# Patient Record
Sex: Male | Born: 1960 | Race: Black or African American | Hispanic: No | Marital: Single | Smoking: Current every day smoker
Health system: Southern US, Community
[De-identification: ages and names within clinical notes are randomized; demographics above are authoritative.]

## PROBLEM LIST (undated history)

## (undated) DIAGNOSIS — I10 Essential (primary) hypertension: Secondary | ICD-10-CM

---

## 2002-04-01 ENCOUNTER — Emergency Department (HOSPITAL_COMMUNITY): Admission: EM | Admit: 2002-04-01 | Discharge: 2002-04-02 | Payer: Self-pay | Admitting: Emergency Medicine

## 2002-04-02 ENCOUNTER — Encounter: Payer: Self-pay | Admitting: Emergency Medicine

## 2002-09-06 ENCOUNTER — Encounter: Payer: Self-pay | Admitting: *Deleted

## 2002-09-06 ENCOUNTER — Emergency Department (HOSPITAL_COMMUNITY): Admission: EM | Admit: 2002-09-06 | Discharge: 2002-09-06 | Payer: Self-pay | Admitting: Emergency Medicine

## 2019-01-04 DEATH — deceased

## 2020-10-27 ENCOUNTER — Encounter (HOSPITAL_BASED_OUTPATIENT_CLINIC_OR_DEPARTMENT_OTHER): Payer: Self-pay | Admitting: Emergency Medicine

## 2020-10-27 ENCOUNTER — Other Ambulatory Visit: Payer: Self-pay

## 2020-10-27 ENCOUNTER — Emergency Department (HOSPITAL_BASED_OUTPATIENT_CLINIC_OR_DEPARTMENT_OTHER)
Admission: EM | Admit: 2020-10-27 | Discharge: 2020-10-27 | Disposition: A | Discharge status: Home / Self Care | Payer: Self-pay | Attending: Emergency Medicine | Admitting: Emergency Medicine

## 2020-10-27 DIAGNOSIS — I10 Essential (primary) hypertension: Secondary | ICD-10-CM | POA: Insufficient documentation

## 2020-10-27 DIAGNOSIS — S86211A Strain of muscle(s) and tendon(s) of anterior muscle group at lower leg level, right leg, initial encounter: Secondary | ICD-10-CM | POA: Insufficient documentation

## 2020-10-27 DIAGNOSIS — Y9301 Activity, walking, marching and hiking: Secondary | ICD-10-CM | POA: Insufficient documentation

## 2020-10-27 DIAGNOSIS — X509XXA Other and unspecified overexertion or strenuous movements or postures, initial encounter: Secondary | ICD-10-CM | POA: Insufficient documentation

## 2020-10-27 DIAGNOSIS — Y990 Civilian activity done for income or pay: Secondary | ICD-10-CM | POA: Insufficient documentation

## 2020-10-27 DIAGNOSIS — S86811A Strain of other muscle(s) and tendon(s) at lower leg level, right leg, initial encounter: Secondary | ICD-10-CM

## 2020-10-27 DIAGNOSIS — F17200 Nicotine dependence, unspecified, uncomplicated: Secondary | ICD-10-CM | POA: Insufficient documentation

## 2020-10-27 HISTORY — DX: Essential (primary) hypertension: I10

## 2020-10-27 MED ORDER — MELOXICAM 7.5 MG PO TABS: 7.5000 mg | ORAL_TABLET | Freq: Every day | ORAL | 0 refills | Status: AC

## 2020-10-27 NOTE — ED Triage Notes (Signed)
Pt states he was at work walking up an incline when he had sudden sharp pain to right calf, heard a pop sound.  Pt c/o pain, increases with movement.  No recent injury, no recent travel.

## 2020-10-27 NOTE — ED Provider Notes (Signed)
[facility] EMERGENCY DEPARTMENT Provider Note   CSN: [id number] Arrival date & time: 10/27/20  1962     History Chief Complaint  Patient presents with  . Leg Pain    David Cochran is a 60 y.o. male.  60 year old male presents with complaint of right calf pain.  Patient states that he was walking up a steep incline today when he felt a sharp pop in his right calf muscle.  Patient states he did not fall to the ground at that time but states that he has had significant difficulty bearing weight on this leg since that injury.  He denies recent antibiotic use, any numbness in the leg, falls or injuries otherwise.        Past Medical History:  Diagnosis Date  . Hypertension     There are no problems to display for this patient.    No family history on file.  Social History   Tobacco Use  . Smoking status: Current Every Day Smoker  . Smokeless tobacco: Never Used    Home Medications Prior to Admission medications   Medication Sig Start Date End Date Taking? Authorizing Provider  meloxicam (MOBIC) 7.5 MG tablet Take 1 tablet (7.5 mg total) by mouth daily for 10 days. 10/27/20 11/06/20 Yes Jeannie Fend, PA-C    Allergies    Patient has no known allergies.  Review of Systems   Review of Systems  Constitutional: Negative for fever.  Musculoskeletal: Positive for gait problem and myalgias. Negative for arthralgias and joint swelling.  Skin: Negative for color change, rash and wound.  Allergic/Immunologic: Negative for immunocompromised state.  Neurological: Negative for weakness and numbness.    Physical Exam Updated Vital Signs BP (!) 151/90   Pulse 91   Temp 98.2 F (36.8 C) (Oral)   Resp 20   Ht 6\' 1"  (1.854 m)   Wt 108.9 kg   SpO2 96%   BMI 31.66 kg/m   Physical Exam Vitals and nursing note reviewed.  Constitutional:      General: He is not in acute distress.    Appearance: He is well-developed. He is not diaphoretic.  HENT:      Head: Normocephalic and atraumatic.  Cardiovascular:     Pulses: Normal pulses.  Pulmonary:     Effort: Pulmonary effort is normal.  Musculoskeletal:        General: Swelling and tenderness present. No deformity.     Right lower leg: Swelling and tenderness present. No deformity or bony tenderness.     Right ankle:     Right Achilles Tendon: Normal. No tenderness or defects. Thompson's test negative.     Left ankle:     Left Achilles Tendon: Normal. No tenderness or defects. Thompson's test negative.     Comments: Swelling of the right calf compared to left with tenderness proximal right calf area.  Sensation intact, no weakness.  Skin:    General: Skin is warm and dry.     Findings: No erythema or rash.  Neurological:     Mental Status: He is alert and oriented to person, place, and time.     Sensory: No sensory deficit.     Motor: No weakness.  Psychiatric:        Behavior: Behavior normal.     ED Results / Procedures / Treatments   Labs (all labs ordered are listed, but only abnormal results are displayed) Labs Reviewed - No data to display  EKG None  Radiology No results  found.  Procedures Procedures   Medications Ordered in ED Medications - No data to display  ED Course  I have reviewed the triage vital signs and the nursing notes.  Pertinent labs & imaging results that were available during my care of the patient were reviewed by me and considered in my medical decision making (see chart for details).  Clinical Course as of 10/27/20 1040  Fri Oct 27, 2020  [id number] 60 year old male with complaint of right calf pain and swelling.  On exam has tenderness to the right calf proximally, Achilles appears intact. Recommend Ace wrap, ice, elevate.  Given prescription for meloxicam to take as needed as directed for pain.  Referred to sports medicine for follow-up. [LM]    Clinical Course User Index [LM] Alden Hipp   MDM Rules/Calculators/A&P                           Final Clinical Impression(s) / ED Diagnoses Final diagnoses:  Strain of calf muscle, right, initial encounter    Rx / DC Orders ED Discharge Orders         Ordered    meloxicam (MOBIC) 7.5 MG tablet  Daily        10/27/20 1031           Alden Hipp 10/27/20 1040    Pricilla Loveless, MD 10/28/20 1719

## 2020-10-27 NOTE — Discharge Instructions (Addendum)
Elevate leg to help with pain and swelling. Apply ice for 20 minutes at a time. Use crutches to weight bear as tolerated. Follow up with sports medicine, call to schedule an appointment.

## 2021-02-12 ENCOUNTER — Emergency Department (HOSPITAL_COMMUNITY): Payer: Self-pay

## 2021-02-12 ENCOUNTER — Encounter (HOSPITAL_COMMUNITY): Payer: Self-pay | Admitting: *Deleted

## 2021-02-12 ENCOUNTER — Emergency Department (HOSPITAL_COMMUNITY)
Admission: EM | Admit: 2021-02-12 | Discharge: 2021-02-13 | Disposition: A | Discharge status: Home / Self Care | Payer: Self-pay | Attending: Emergency Medicine | Admitting: Emergency Medicine

## 2021-02-12 ENCOUNTER — Other Ambulatory Visit: Payer: Self-pay

## 2021-02-12 DIAGNOSIS — I10 Essential (primary) hypertension: Secondary | ICD-10-CM | POA: Insufficient documentation

## 2021-02-12 DIAGNOSIS — R52 Pain, unspecified: Secondary | ICD-10-CM

## 2021-02-12 DIAGNOSIS — Z20822 Contact with and (suspected) exposure to covid-19: Secondary | ICD-10-CM | POA: Insufficient documentation

## 2021-02-12 DIAGNOSIS — Z23 Encounter for immunization: Secondary | ICD-10-CM | POA: Insufficient documentation

## 2021-02-12 DIAGNOSIS — Y92480 Sidewalk as the place of occurrence of the external cause: Secondary | ICD-10-CM | POA: Insufficient documentation

## 2021-02-12 DIAGNOSIS — S51811A Laceration without foreign body of right forearm, initial encounter: Secondary | ICD-10-CM | POA: Insufficient documentation

## 2021-02-12 DIAGNOSIS — S51812A Laceration without foreign body of left forearm, initial encounter: Secondary | ICD-10-CM

## 2021-02-12 DIAGNOSIS — S83105A Unspecified dislocation of left knee, initial encounter: Secondary | ICD-10-CM | POA: Insufficient documentation

## 2021-02-12 DIAGNOSIS — F17200 Nicotine dependence, unspecified, uncomplicated: Secondary | ICD-10-CM | POA: Insufficient documentation

## 2021-02-12 LAB — BASIC METABOLIC PANEL
Anion gap: 6 (ref 5–15)
BUN: 9 mg/dL (ref 8–23)
CO2: 28 mmol/L (ref 22–32)
Calcium: 9.4 mg/dL (ref 8.9–10.3)
Chloride: 106 mmol/L (ref 98–111)
Creatinine, Ser: 0.96 mg/dL (ref 0.61–1.24)
GFR, Estimated: >60 mL/min (ref >60)
Glucose, Bld: 115 mg/dL — ABNORMAL HIGH (ref 70–99)
Potassium: 3.7 mmol/L (ref 3.5–5.1)
Sodium: 140 mmol/L (ref 135–145)

## 2021-02-12 LAB — CBC
HCT: 40.9 % (ref 39.0–52.0)
Hemoglobin: 13.8 g/dL (ref 13.0–17.0)
MCH: 29.7 pg (ref 26.0–34.0)
MCHC: 33.7 g/dL (ref 30.0–36.0)
MCV: 88.1 fL (ref 80.0–100.0)
Platelets: 231 10*3/uL (ref 150–400)
RBC: 4.64 MIL/uL (ref 4.22–5.81)
RDW: 13.2 % (ref 11.5–15.5)
WBC: 6.4 10*3/uL (ref 4.0–10.5)
nRBC: 0 % (ref 0.0–0.2)

## 2021-02-12 LAB — RESP PANEL BY RT-PCR (FLU A&B, COVID) ARPGX2
Influenza A by PCR: NEGATIVE
Influenza B by PCR: NEGATIVE
SARS Coronavirus 2 by RT PCR: NEGATIVE

## 2021-02-12 LAB — I-STAT CHEM 8, ED
BUN: 11 mg/dL (ref 8–23)
Calcium, Ion: 1.21 mmol/L (ref 1.15–1.40)
Chloride: 105 mmol/L (ref 98–111)
Creatinine, Ser: 0.9 mg/dL (ref 0.61–1.24)
Glucose, Bld: 113 mg/dL — ABNORMAL HIGH (ref 70–99)
HCT: 41 % (ref 39.0–52.0)
Hemoglobin: 13.9 g/dL (ref 13.0–17.0)
Potassium: 3.7 mmol/L (ref 3.5–5.1)
Sodium: 142 mmol/L (ref 135–145)
TCO2: 26 mmol/L (ref 22–32)

## 2021-02-12 MED ORDER — PROPOFOL 1000 MG/100ML IV EMUL
INTRAVENOUS | Status: AC | PRN
  Administered 2021-02-12: 50 mg via INTRAVENOUS

## 2021-02-12 MED ORDER — HYDROMORPHONE HCL 1 MG/ML IJ SOLN
1.0000 mg | Freq: Once | INTRAMUSCULAR | Status: AC
  Administered 2021-02-12: 1 mg via INTRAVENOUS
  Filled 2021-02-12: qty 1

## 2021-02-12 MED ORDER — IOHEXOL 350 MG/ML SOLN
100.0000 mL | Freq: Once | INTRAVENOUS | Status: AC | PRN
  Administered 2021-02-12: 100 mL via INTRAVENOUS

## 2021-02-12 MED ORDER — TETANUS-DIPHTH-ACELL PERTUSSIS 5-2.5-18.5 LF-MCG/0.5 IM SUSY
0.5000 mL | PREFILLED_SYRINGE | Freq: Once | INTRAMUSCULAR | Status: AC
  Administered 2021-02-12: 0.5 mL via INTRAMUSCULAR
  Filled 2021-02-12: qty 0.5

## 2021-02-12 MED ORDER — LIDOCAINE HCL (PF) 1 % IJ SOLN
5.0000 mL | Freq: Once | INTRAMUSCULAR | Status: AC
  Administered 2021-02-12: 5 mL
  Filled 2021-02-12: qty 5
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
## 2021-02-12 NOTE — Discharge Instructions (Addendum)
Follow-up with Dr. August Saucer.  Call for an appointment.  He should see you in about a week.  Keep the knee immobilizer on.  No weightbearing with the leg.  Use Tylenol or Motrin at home for discomfort.  For more significant pain you can use the oxycodone prescription provided.

## 2021-02-12 NOTE — ED Notes (Signed)
Pt is alert and speaking in full sentences. EDP has talked with provider and talked with this TRN stating patient can go to CT scans at any time.  Pt is going to CT at this time with TRN and pt is on monitor

## 2021-02-12 NOTE — ED Provider Notes (Signed)
  Provider Note MRN:  [id number]  Arrival date & time: 02/13/21    ED Course and Medical Decision Making  Assumed care from Dr. Rubin Payor at shift change.  Knee dislocation awaiting MRI, plan is for discharge thereafter.  Procedures  Final Clinical Impressions(s) / ED Diagnoses     ICD-10-CM   1. Knee dislocation, left, initial encounter  S83.105A     2. Pain  R52 DG Knee Left Port    DG Knee Left Port    3. Laceration of left forearm, initial encounter  N86.767M       ED Discharge Orders          Ordered    oxyCODONE (ROXICODONE) 5 MG immediate release tablet  Every 4 hours PRN        02/13/21 0112              Discharge Instructions      Follow-up with Dr. August Saucer.  Call for an appointment.  He should see you in about a week.  Keep the knee immobilizer on.  No weightbearing with the leg.  Use Tylenol or Motrin at home for discomfort.  For more significant pain you can use the oxycodone prescription provided.      Elmer Sow. Pilar Plate, MD [facility] Emergency Medicine [facility] [email or web address]    Sabas Sous, MD 02/13/21 [phone]
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
## 2021-02-12 NOTE — Consult Note (Signed)
Responded to page, pt unavailable, no family present, staff will page again if further chaplain services needed.  Rev. Destani Wamser Chaplain 
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
## 2021-02-13 MED ORDER — OXYCODONE HCL 5 MG PO TABS: 5.0000 mg | ORAL_TABLET | ORAL | 0 refills | Status: AC | PRN

## 2021-02-13 NOTE — ED Notes (Signed)
Pt back from MRI 

## 2021-03-01 ENCOUNTER — Ambulatory Visit: Payer: Self-pay | Admitting: Orthopedic Surgery

## 2021-03-14 ENCOUNTER — Ambulatory Visit: Payer: Self-pay | Admitting: Orthopedic Surgery
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
